# Patient Record
Sex: Male | Born: 1990 | Race: Black or African American | Hispanic: No | Marital: Single | Smoking: Never smoker
Health system: Southern US, Community
[De-identification: ages and names within clinical notes are randomized; demographics above are authoritative.]

## PROBLEM LIST (undated history)

## (undated) DIAGNOSIS — D649 Anemia, unspecified: Secondary | ICD-10-CM

## (undated) DIAGNOSIS — T7840XA Allergy, unspecified, initial encounter: Secondary | ICD-10-CM

## (undated) HISTORY — DX: Anemia, unspecified: D64.9

## (undated) HISTORY — DX: Allergy, unspecified, initial encounter: T78.40XA

---
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
## 2011-01-23 LAB — COMPREHENSIVE METABOLIC PANEL
Albumin: 3.4 — ABNORMAL LOW
Alkaline Phosphatase: 108
BUN: 6
CO2: 26
Chloride: 101
Glucose, Bld: 101 — ABNORMAL HIGH
Potassium: 3.6
Total Bilirubin: 1

## 2011-01-23 LAB — BASIC METABOLIC PANEL
CO2: 29
Chloride: 100
Creatinine, Ser: 1.31
Potassium: 3.4 — ABNORMAL LOW
Sodium: 136

## 2011-01-23 LAB — CBC
HCT: 40
HCT: 43.5
Hemoglobin: 13.2
Hemoglobin: 14.4
MCHC: 33.2
MCV: 88.7
Platelets: 135 — ABNORMAL LOW
Platelets: 135 — ABNORMAL LOW
RBC: 4.53
RBC: 4.91
WBC: 5

## 2011-01-23 LAB — URINALYSIS, ROUTINE W REFLEX MICROSCOPIC
Bilirubin Urine: NEGATIVE
Hgb urine dipstick: NEGATIVE
Protein, ur: NEGATIVE
Urobilinogen, UA: 1

## 2011-01-23 LAB — DIFFERENTIAL
Basophils Absolute: 0
Basophils Absolute: 0
Basophils Relative: 0
Lymphocytes Relative: 9 — ABNORMAL LOW
Lymphs Abs: 0.6 — ABNORMAL LOW
Monocytes Absolute: 0.8
Neutro Abs: 2.8
Neutro Abs: 5.2
Neutrophils Relative %: 81 — ABNORMAL HIGH

## 2011-05-06 ENCOUNTER — Ambulatory Visit (INDEPENDENT_AMBULATORY_CARE_PROVIDER_SITE_OTHER): Payer: 59

## 2011-05-06 DIAGNOSIS — Z0000 Encounter for general adult medical examination without abnormal findings: Secondary | ICD-10-CM

## 2012-07-18 ENCOUNTER — Encounter (HOSPITAL_COMMUNITY): Payer: Self-pay | Admitting: Family Medicine

## 2012-07-18 ENCOUNTER — Emergency Department (HOSPITAL_COMMUNITY): Payer: Self-pay

## 2012-07-18 ENCOUNTER — Emergency Department (HOSPITAL_COMMUNITY)
Admission: EM | Admit: 2012-07-18 | Discharge: 2012-07-18 | Disposition: A | Discharge status: Home / Self Care | Payer: No Typology Code available for payment source | Attending: Emergency Medicine | Admitting: Emergency Medicine

## 2012-07-18 DIAGNOSIS — I498 Other specified cardiac arrhythmias: Secondary | ICD-10-CM | POA: Insufficient documentation

## 2012-07-18 DIAGNOSIS — Y9389 Activity, other specified: Secondary | ICD-10-CM | POA: Insufficient documentation

## 2012-07-18 DIAGNOSIS — R51 Headache: Secondary | ICD-10-CM | POA: Insufficient documentation

## 2012-07-18 DIAGNOSIS — R55 Syncope and collapse: Secondary | ICD-10-CM | POA: Insufficient documentation

## 2012-07-18 DIAGNOSIS — R001 Bradycardia, unspecified: Secondary | ICD-10-CM

## 2012-07-18 DIAGNOSIS — R110 Nausea: Secondary | ICD-10-CM | POA: Insufficient documentation

## 2012-07-18 DIAGNOSIS — Y92410 Unspecified street and highway as the place of occurrence of the external cause: Secondary | ICD-10-CM | POA: Insufficient documentation

## 2012-07-18 DIAGNOSIS — M542 Cervicalgia: Secondary | ICD-10-CM | POA: Insufficient documentation

## 2012-07-18 LAB — GLUCOSE, CAPILLARY: Glucose-Capillary: 78 mg/dL (ref 70–99)

## 2012-07-18 MED ORDER — METHOCARBAMOL 500 MG PO TABS: 1000.0000 mg | ORAL_TABLET | Freq: Four times a day (QID) | ORAL | Status: DC

## 2012-07-18 MED ORDER — IBUPROFEN 600 MG PO TABS: 600.0000 mg | ORAL_TABLET | Freq: Four times a day (QID) | ORAL | Status: DC | PRN

## 2012-07-18 MED ORDER — IBUPROFEN 400 MG PO TABS: 600.0000 mg | ORAL_TABLET | Freq: Once | ORAL | Status: DC

## 2012-07-18 NOTE — ED Notes (Signed)
Patient transported to CT and xray 
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
## 2012-07-18 NOTE — ED Provider Notes (Signed)
Medical screening examination/treatment/procedure(s) were performed by non-physician practitioner and as supervising physician I was immediately available for consultation/collaboration.    Vida Roller, MD 07/18/12 [phone]

## 2013-04-24 ENCOUNTER — Ambulatory Visit (INDEPENDENT_AMBULATORY_CARE_PROVIDER_SITE_OTHER): Payer: PRIVATE HEALTH INSURANCE | Admitting: Emergency Medicine

## 2013-04-24 VITALS — BP 126/84 | HR 45 | Temp 98.6°F | Resp 16 | Ht 72.25 in | Wt 155.0 lb

## 2013-04-24 DIAGNOSIS — Z0000 Encounter for general adult medical examination without abnormal findings: Secondary | ICD-10-CM

## 2013-04-24 DIAGNOSIS — Z23 Encounter for immunization: Secondary | ICD-10-CM

## 2013-04-24 NOTE — Progress Notes (Signed)
[facility] [address], [place] [address] [phone]  Date:  04/24/2013   Name:  Jason Mcintosh   DOB:  October 05, 1990   MRN:  [id number]  PCP:  Default, Provider, MD    Chief Complaint: Annual Exam   History of Present Illness:  Jason Mcintosh is a 22 y.o. very pleasant male patient who presents with the following:  Recently graduated and has moved to [place].  Now here for the holidays and requests a CPE.  No medications.  Daily runner.  No flu shot.  UTD on tetanus.  No improvement with over the counter medications or other home remedies. Denies other complaint or health concern today.   There are no active problems to display for this patient.   Past Medical History  Diagnosis Date  . Allergy   . Anemia     History reviewed. No pertinent past surgical history.  History  Substance Use Topics  . Smoking status: Never Smoker   . Smokeless tobacco: Not on file  . Alcohol Use: No    Family History  Problem Relation Age of Onset  . Healthy Mother   . Healthy Father   . Healthy Sister   . Healthy Brother     Allergies  Allergen Reactions  . Sulfa Antibiotics Swelling    Medication list has been reviewed and updated.  Current Outpatient Prescriptions on File Prior to Visit  Medication Sig Dispense Refill  . ibuprofen (ADVIL,MOTRIN) 600 MG tablet Take 1 tablet (600 mg total) by mouth every 6 (six) hours as needed for pain.  20 tablet  0  . methocarbamol (ROBAXIN) 500 MG tablet Take 2 tablets (1,000 mg total) by mouth 4 (four) times daily.  20 tablet  0   No current facility-administered medications on file prior to visit.    Review of Systems:  As per HPI, otherwise negative.    Physical Examination: Filed Vitals:   04/24/13 1604  BP: 126/84  Pulse: 45  Temp: 98.6 F (37 C)  Resp: 16   Filed Vitals:   04/24/13 1604  Height: 6' 0.25" (1.835 m)  Weight: 155 lb (70.308 kg)   Body mass index is 20.88 kg/(m^2). Ideal Body  Weight: Weight in (lb) to have BMI = 25: 185.2  GEN: WDWN, NAD, Non-toxic, A & O x 3 HEENT: Atraumatic, Normocephalic. Neck supple. No masses, No LAD. Ears and Nose: No external deformity. CV: RRR, No M/G/R. No JVD. No thrill. No extra heart sounds. PULM: CTA B, no wheezes, crackles, rhonchi. No retractions. No resp. distress. No accessory muscle use. ABD: S, NT, ND, +BS. No rebound. No HSM. EXTR: No c/c/e NEURO Normal gait.  PSYCH: Normally interactive. Conversant. Not depressed or anxious appearing.  Calm demeanor.    Assessment and Plan: Routine physical  Labs pending in morning as he is not fasting   Signed,  Phillips Odor, MD

## 2015-01-29 ENCOUNTER — Ambulatory Visit: Payer: Worker's Compensation

## 2015-01-29 ENCOUNTER — Ambulatory Visit (INDEPENDENT_AMBULATORY_CARE_PROVIDER_SITE_OTHER): Payer: Worker's Compensation | Admitting: Family Medicine

## 2015-01-29 VITALS — BP 118/80 | HR 50 | Temp 98.4°F | Resp 16 | Ht 73.0 in | Wt 154.0 lb

## 2015-01-29 DIAGNOSIS — S6991XA Unspecified injury of right wrist, hand and finger(s), initial encounter: Secondary | ICD-10-CM

## 2015-01-29 DIAGNOSIS — S63501A Unspecified sprain of right wrist, initial encounter: Secondary | ICD-10-CM | POA: Diagnosis not present

## 2015-01-29 NOTE — Progress Notes (Signed)
Subjective:  This chart was scribed for Jason Sorenson, MD by [facility], medical scribe at [facility].The patient was seen in exam room 02 and the patient's care was started at 5:14 PM.   Patient ID: Jason Mcintosh, male    DOB: [birth year], 24 y.o.   MRN: [id number] Chief Complaint  Patient presents with  . Wrist Injury    right, x 1 day   HPI HPI Comments: Jason Mcintosh is a 24 y.o. male who presents to [facility] complaining of a right wrist injury while at work.  Flattened out in the air and landed on his wrist while trying to avoid landing on a kid. Feels pain up to his forearm. Pain occasionally shoots up to his shoulder. RICE treatment and wrapping for relief. 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   No current outpatient prescriptions on file prior to visit.   No current facility-administered medications on file prior to visit.   Allergies  Allergen Reactions  . Sulfa Antibiotics Swelling   Review of Systems  Constitutional: Negative for fever, chills, diaphoresis, activity change, appetite change, fatigue and unexpected weight change.  Respiratory: Negative for shortness of breath.   Cardiovascular: Negative for chest pain, palpitations and leg swelling.  Gastrointestinal: Negative for vomiting and abdominal pain.  Musculoskeletal: Positive for myalgias, joint swelling and arthralgias.  Skin: Negative for color change, pallor and wound.  Neurological: Negative for weakness and numbness.  Hematological: Negative for adenopathy. Does not bruise/bleed easily.      Objective:  BP 118/80 mmHg  Pulse 50  Temp(Src) 98.4 F (36.9 C) (Oral)  Resp 16  Ht  (1.854 m)  Wt 154 lb (69.854 kg)  BMI 20.32 kg/m2  SpO2 99% Physical Exam  Constitutional: He is oriented to person, place, and time. He appears well-developed and well-nourished. No distress.  HENT:  Head: Normocephalic and atraumatic.  Eyes: Pupils are equal, round, and reactive  to light.  Neck: Normal range of motion.  Cardiovascular: Normal rate and regular rhythm.   Pulmonary/Chest: Effort normal. No respiratory distress.  Musculoskeletal: Normal range of motion.  No ROM in his hand. Pain with moving fingers and elbow. No ability to pronate or supinate. Moderate edema over the dorsum of his hand. No discoloration or bruising. Positive snuff box tenderness. Severe swelling ulnar aspect to mid forearm.  Neurological: He is alert and oriented to person, place, and time.  Skin: Skin is warm and dry.  Psychiatric: He has a normal mood and affect. His behavior is normal.  Nursing note and vitals reviewed.  [facility] reading (PRIMARY) by Dr. Clelia Croft: Right wrist and right forearm: no acute bony abnormality no fracture.    Dg Forearm Right  01/29/2015   CLINICAL DATA:  pain after fall on trampoline onto hyperextended wrist  EXAM: RIGHT FOREARM - 2 VIEW  COMPARISON:  None.  FINDINGS: There is no evidence of fracture or other focal bone lesions. Soft tissues are unremarkable.  IMPRESSION: Negative.   Electronically Signed   By: Ellery Plunk M.D.   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 Initial encounter.  EXAM: RIGHT WRIST - COMPLETE 3+ VIEW  COMPARISON:  None.  FINDINGS: No evidence of acute fracture or dislocation. Joint spaces well preserved. Well-preserved bone mineral density. No intrinsic osseous abnormalities.  IMPRESSION: Normal examination.   Electronically Signed   By: Hulan Saas M.D.   On: 01/29/2015 18:17    Assessment & Plan:   1. Wrist injuries, right, initial encounter   2. Wrist sprain, right, initial encounter   placed in prefab splint and wear sling while at work. RICE. Light duty. Recheck in 4d  Orders Placed This Encounter  Procedures  . DG Wrist Complete Right    Standing Status: Future     Number of  Occurrences: 1     Standing Expiration Date: 01/29/2016    Order Specific Question:  Reason for Exam (SYMPTOM  OR DIAGNOSIS REQUIRED)    Answer:  wrist pain, s/p fall with hyperextension 24 hrs prior - tender throughoutt    Order Specific Question:  Preferred imaging location?    Answer:  External  . DG Forearm Right    Standing Status: Future     Number of Occurrences: 1     Standing Expiration Date: 01/29/2016    Order Specific Question:  Reason for Exam (SYMPTOM  OR DIAGNOSIS REQUIRED)    Answer:  pain after fall on trampoline onto hyperextended wrist    Order Specific Question:  Preferred imaging location?    Answer:  External    I personally performed the services described in this documentation, which was scribed in my presence. The recorded information has been reviewed and considered, and addended by me as needed.  Jason Sorenson, MD MPH   By signing my name below, I, Nadim Abuhashem, attest that this documentation has been prepared under the direction and in the presence of Jason Sorenson, MD.  Electronically Signed: Conchita Paris, medical scribe. 01/29/2015, 5:20 PM.

## 2015-01-29 NOTE — Patient Instructions (Addendum)
I suspect you have a VERY severe sprain - the more you can continue with RICE in the next 3d, the more likely you will be to start to resume normal activities sooner.  Due to the amount of swelling you have I am concerned that there is a possbility that you have a complete ligament tear. We will recheck on Thursday and can consider need for MRI depending on how you are doing at that time. Wrist Sprain with Rehab A sprain is an injury in which a ligament that maintains the proper alignment of a joint is partially or completely torn. The ligaments of the wrist are susceptible to sprains. Sprains are classified into three categories. Grade 1 sprains cause pain, but the tendon is not lengthened. Grade 2 sprains include a lengthened ligament because the ligament is stretched or partially ruptured. With grade 2 sprains there is still function, although the function may be diminished. Grade 3 sprains are characterized by a complete tear of the tendon or muscle, and function is usually impaired. SYMPTOMS   Pain tenderness, inflammation, and/or bruising (contusion) of the injury.  A "pop" or tear felt and/or heard at the time of injury.  Decreased wrist function. CAUSES  A wrist sprain occurs when a force is placed on one or more ligaments that is greater than it/they can withstand. Common mechanisms of injury include:  Catching a ball with you hands.  Repetitive and/ or strenuous extension or flexion of the wrist. RISK INCREASES WITH:  Previous wrist injury.  Contact sports (boxing or wrestling).  Activities in which falling is common.  Poor strength and flexibility.  Improperly fitted or padded protective equipment. PREVENTION  Warm up and stretch properly before activity.  Allow for adequate recovery between workouts.  Maintain physical fitness:  Strength, flexibility, and endurance.  Cardiovascular fitness.  Protect the wrist joint by limiting its motion with the use of taping,  braces, or splints.  Protect the wrist after injury for 6 to 12 months. PROGNOSIS  The prognosis for wrist sprains depends on the degree of injury. Grade 1 sprains require 2 to 6 weeks of treatment. Grade 2 sprains require 6 to 8 weeks of treatment, and grade 3 sprains require up to 12 weeks.  RELATED COMPLICATIONS   Prolonged healing time, if improperly treated or re-injured.  Recurrent symptoms that result in a chronic problem.  Injury to nearby structures (bone, cartilage, nerves, or tendons).  Arthritis of the wrist.  Inability to compete in athletics at a high level.  Wrist stiffness or weakness.  Progression to a complete rupture of the ligament. TREATMENT  Treatment initially involves resting from any activities that aggravate the symptoms, and the use of ice and medications to help reduce pain and inflammation. Your caregiver may recommend immobilizing the wrist for a period of time in order to reduce stress on the ligament and allow for healing. After immobilization it is important to perform strengthening and stretching exercises to help regain strength and a full range of motion. These exercises may be completed at home or with a therapist. Surgery is not usually required for wrist sprains, unless the ligament has been ruptured (grade 3 sprain). MEDICATION   If pain medication is necessary, then nonsteroidal anti-inflammatory medications, such as aspirin and ibuprofen, or other minor pain relievers, such as acetaminophen, are often recommended.  Do not take pain medication for 7 days before surgery.  Prescription pain relievers may be given if deemed necessary by your caregiver. Use only as directed and  only as much as you need. HEAT AND COLD  Cold treatment (icing) relieves pain and reduces inflammation. Cold treatment should be applied for 10 to 15 minutes every 2 to 3 hours for inflammation and pain and immediately after any activity that aggravates your symptoms. Use ice  packs or massage the area with a piece of ice (ice massage).  Heat treatment may be used prior to performing the stretching and strengthening activities prescribed by your caregiver, physical therapist, or athletic trainer. Use a heat pack or soak your injury in warm water. SEEK MEDICAL CARE IF:  Treatment seems to offer no benefit, or the condition worsens.  Any medications produce adverse side effects. EXERCISES RANGE OF MOTION (ROM) AND STRETCHING EXERCISES - Wrist Sprain  These exercises may help you when beginning to rehabilitate your injury. Your symptoms may resolve with or without further involvement from your physician, physical therapist or athletic trainer. While completing these exercises, remember:   Restoring tissue flexibility helps normal motion to return to the joints. This allows healthier, less painful movement and activity.  An effective stretch should be held for at least 30 seconds.  A stretch should never be painful. You should only feel a gentle lengthening or release in the stretched tissue. RANGE OF MOTION - Wrist Flexion, Active-Assisted  Extend your right / left elbow with your fingers pointing down.*  Gently pull the back of your hand towards you until you feel a gentle stretch on the top of your forearm.  Hold this position for __________ seconds. Repeat __________ times. Complete this exercise __________ times per day.  *If directed by your physician, physical therapist or athletic trainer, complete this stretch with your elbow bent rather than extended. RANGE OF MOTION - Wrist Extension, Active-Assisted  Extend your right / left elbow and turn your palm upwards.*  Gently pull your palm/fingertips back so your wrist extends and your fingers point more toward the ground.  You should feel a gentle stretch on the inside of your forearm.  Hold this position for __________ seconds. Repeat __________ times. Complete this exercise __________ times per  day. *If directed by your physician, physical therapist or athletic trainer, complete this stretch with your elbow bent, rather than extended. RANGE OF MOTION - Supination, Active  Stand or sit with your elbows at your side. Bend your right / left elbow to 90 degrees.  Turn your palm upward until you feel a gentle stretch on the inside of your forearm.  Hold this position for __________ seconds. Slowly release and return to the starting position. Repeat __________ times. Complete this stretch __________ times per day.  RANGE OF MOTION - Pronation, Active  Stand or sit with your elbows at your side. Bend your right / left elbow to 90 degrees.  Turn your palm downward until you feel a gentle stretch on the top of your forearm.  Hold this position for __________ seconds. Slowly release and return to the starting position. Repeat __________ times. Complete this stretch __________ times per day.  STRETCH - Wrist Flexion  Place the back of your right / left hand on a tabletop leaving your elbow slightly bent. Your fingers should point away from your body.  Gently press the back of your hand down onto the table by straightening your elbow. You should feel a stretch on the top of your forearm.  Hold this position for __________ seconds. Repeat __________ times. Complete this stretch __________ times per day.  STRETCH - Wrist Extension  Place your  right / left fingertips on a tabletop leaving your elbow slightly bent. Your fingers should point backwards.  Gently press your fingers and palm down onto the table by straightening your elbow. You should feel a stretch on the inside of your forearm.  Hold this position for __________ seconds. Repeat __________ times. Complete this stretch __________ times per day.  STRENGTHENING EXERCISES - Wrist Sprain These exercises may help you when beginning to rehabilitate your injury. They may resolve your symptoms with or without further involvement from  your physician, physical therapist or athletic trainer. While completing these exercises, remember:   Muscles can gain both the endurance and the strength needed for everyday activities through controlled exercises.  Complete these exercises as instructed by your physician, physical therapist or athletic trainer. Progress with the resistance and repetition exercises only as your caregiver advises. STRENGTH - Wrist Flexors  Sit with your right / left forearm palm-up and fully supported. Your elbow should be resting below the height of your shoulder. Allow your wrist to extend over the edge of the surface.  Loosely holding a __________ weight or a piece of rubber exercise band/tubing, slowly curl your hand up toward your forearm.  Hold this position for __________ seconds. Slowly lower the wrist back to the starting position in a controlled manner. Repeat __________ times. Complete this exercise __________ times per day.  STRENGTH - Wrist Extensors  Sit with your right / left forearm palm-down and fully supported. Your elbow should be resting below the height of your shoulder. Allow your wrist to extend over the edge of the surface.  Loosely holding a __________ weight or a piece of rubber exercise band/tubing, slowly curl your hand up toward your forearm.  Hold this position for __________ seconds. Slowly lower the wrist back to the starting position in a controlled manner. Repeat __________ times. Complete this exercise __________ times per day.  STRENGTH - Ulnar Deviators  Stand with a ____________________ weight in your right / left hand, or sit holding on to the rubber exercise band/tubing with your opposite arm supported.  Move your wrist so that your pinkie travels toward your forearm and your thumb moves away from your forearm.  Hold this position for __________ seconds and then slowly lower the wrist back to the starting position. Repeat __________ times. Complete this exercise  __________ times per day STRENGTH - Radial Deviators  Stand with a ____________________ weight in your  right / left hand, or sit holding on to the rubber exercise band/tubing with your arm supported.  Raise your hand upward in front of you or pull up on the rubber tubing.  Hold this position for __________ seconds and then slowly lower the wrist back to the starting position. Repeat __________ times. Complete this exercise __________ times per day. STRENGTH - Forearm Supinators  Sit with your right / left forearm supported on a table, keeping your elbow below shoulder height. Rest your hand over the edge, palm down.  Gently grip a hammer or a soup ladle.  Without moving your elbow, slowly turn your palm and hand upward to a "thumbs-up" position.  Hold this position for __________ seconds. Slowly return to the starting position. Repeat __________ times. Complete this exercise __________ times per day.  STRENGTH - Forearm Pronators  Sit with your right / left forearm supported on a table, keeping your elbow below shoulder height. Rest your hand over the edge, palm up.  Gently grip a hammer or a soup ladle.  Without moving your  elbow, slowly turn your palm and hand upward to a "thumbs-up" position.  Hold this position for __________ seconds. Slowly return to the starting position. Repeat __________ times. Complete this exercise __________ times per day.  STRENGTH - Grip  Grasp a tennis ball, a dense sponge, or a large, rolled sock in your hand.  Squeeze as hard as you can without increasing any pain.  Hold this position for __________ seconds. Release your grip slowly. Repeat __________ times. Complete this exercise __________ times per day.  Document Released: 04/14/2005 Document Revised: 07/07/2011 Document Reviewed: 07/27/2008 [facility] Patient Information 2015 [place], [place]. This information is not intended to replace advice given to you by your health care provider. Make  sure you discuss any questions you have with your health care provider.

## 2015-01-31 NOTE — Progress Notes (Signed)
Patient has a scheduled appointment on 02/01/15 @ 1pm with Dr Clelia Croft for a WC Follow-up.

## 2015-02-01 ENCOUNTER — Other Ambulatory Visit: Payer: Self-pay | Admitting: Family Medicine

## 2015-06-26 ENCOUNTER — Ambulatory Visit (INDEPENDENT_AMBULATORY_CARE_PROVIDER_SITE_OTHER): Payer: No Typology Code available for payment source | Admitting: Internal Medicine

## 2015-06-26 VITALS — BP 108/70 | HR 50 | Temp 98.4°F | Resp 18 | Ht 75.0 in | Wt 159.8 lb

## 2015-06-26 DIAGNOSIS — M25511 Pain in right shoulder: Secondary | ICD-10-CM

## 2015-06-26 MED ORDER — MELOXICAM 15 MG PO TABS: 15.0000 mg | ORAL_TABLET | Freq: Every day | ORAL | Status: AC

## 2015-06-26 NOTE — Patient Instructions (Signed)

## 2015-06-26 NOTE — Progress Notes (Signed)
   Subjective:    Patient ID: Jason Mcintosh, male    DOB: 1991/02/12, 25 y.o.   MRN: [id number]  HPI  Chief Complaint  Patient presents with  . Shoulder Pain    right shoulder pain. Cannot lift arm above head, across chest.    25 year old healthy patient complaining of shoulder pain for the last 2 weeks. No known injury. Early in the day about 2 weeks ago he noticed trouble with range of motion. It hurts to brush teeth/ Hair. It hurts to pick things up. Job supervising trampoline center. Active in sports. No prior shoulder problems  Review of Systems Noncontributory    Objective:   Physical Exam BP 108/70 mmHg  Pulse 50  Temp(Src) 98.4 F (36.9 C) (Oral)  Resp 18  Ht  (1.905 m)  Wt 159 lb 12.8 oz (72.485 kg)  BMI 19.97 kg/m2  SpO2 99% Appears healthy in no acute distress Right shoulder is tender to palpation over the ac joint Biceps tendon is intact but tender at the attachment of the acromioclavicular joint He has pain with external rotation, and adduction, and anterior elevation against resistance. Adduction good 90. He is mildly tender in the trapezius with some tightness but is nontender along the scapular border. Biceps function is good No chest wall tenderness No upper extremity neurological findings      Assessment & Plan:  Acromio clavicular tendinitis/bursitis  Meloxicam Ice for swelling or pain Heat morning Exercises twice a day Follow-up 2-4 weeks if not well

## 2015-11-04 ENCOUNTER — Encounter (HOSPITAL_COMMUNITY): Payer: Self-pay | Admitting: Emergency Medicine

## 2015-11-04 ENCOUNTER — Ambulatory Visit (HOSPITAL_COMMUNITY)
Admission: EM | Admit: 2015-11-04 | Discharge: 2015-11-04 | Disposition: A | Discharge status: Home / Self Care | Payer: PRIVATE HEALTH INSURANCE | Attending: Family Medicine | Admitting: Family Medicine

## 2015-11-04 DIAGNOSIS — S01511A Laceration without foreign body of lip, initial encounter: Secondary | ICD-10-CM | POA: Diagnosis not present

## 2015-11-04 MED ORDER — CEPHALEXIN 500 MG PO CAPS: 500.0000 mg | ORAL_CAPSULE | Freq: Four times a day (QID) | ORAL | Status: AC
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
## 2015-11-04 NOTE — ED Notes (Signed)
D/c by frank patrick, pa 

## 2015-11-04 NOTE — ED Provider Notes (Signed)
CSN: [id number]     Arrival date & time 11/04/15  1943 History   First MD Initiated Contact with Patient 11/04/15 2053     Chief Complaint  Patient presents with  . Oral Swelling   (Consider location/radiation/quality/duration/timing/severity/associated sxs/prior Treatment) HPI History obtained from patient: Location: Left lower lip  Context/Duration: Last night during performance struck standing speaker with left lower lip  Severity: 2   Quality: Aching Timing:       Constant     Home Treatment: Wound cleaning Associated symptoms:  None Family History: All healthy known history    Past Medical History  Diagnosis Date  . Allergy   . Anemia    History reviewed. No pertinent past surgical history. Family History  Problem Relation Age of Onset  . Healthy Mother   . Healthy Father   . Healthy Sister   . Healthy Brother    Social History  Substance Use Topics  . Smoking status: Never Smoker   . Smokeless tobacco: Never Used  . Alcohol Use: No    Review of Systems  Denies: HEADACHE, NAUSEA, ABDOMINAL PAIN, CHEST PAIN, CONGESTION, DYSURIA, SHORTNESS OF BREATH  Allergies  Sulfa antibiotics  Home Medications   Prior to Admission medications   Medication Sig Start Date End Date Taking? Authorizing Provider  cephALEXin (KEFLEX) 500 MG capsule Take 1 capsule (500 mg total) by mouth 4 (four) times daily. 11/04/15   Tharon AquasFrank C Edward Trevino, PA  meloxicam (MOBIC) 15 MG tablet Take 1 tablet (15 mg total) by mouth daily. 06/26/15   Tonye Pearsonobert P Doolittle, MD   Meds Ordered and Administered this Visit  Medications - No data to display  BP 122/63 mmHg  Pulse 49  Temp(Src) 97.8 F (36.6 C) (Oral)  Resp 16  SpO2 100% No data found.   Physical Exam NURSES NOTES AND VITAL SIGNS REVIEWED. CONSTITUTIONAL: Well developed, well nourished, no acute distress HEENT: normocephalic, atraumatic. Left lower lip swollen, non tender to palpation. Healed laceration from tooth.  EYES: Conjunctiva  normal NECK:normal ROM, supple, no adenopathy PULMONARY:No respiratory distress, normal effort ABDOMINAL: Soft, ND, NT BS+, No CVAT MUSCULOSKELETAL: Normal ROM of all extremities,  SKIN: warm and dry without rash PSYCHIATRIC: Mood and affect, behavior are normal  ED Course  Procedures (including critical care time)  Labs Review Labs Reviewed - No data to display  Imaging Review No results found.   Visual Acuity Review  Right Eye Distance:   Left Eye Distance:   Bilateral Distance:    Right Eye Near:   Left Eye Near:    Bilateral Near:         MDM   1. Lip laceration, initial encounter     Patient is reassured that there are no issues that require transfer to higher level of care at this time or additional tests. Patient is advised to continue home symptomatic treatment. Patient is advised that if there are new or worsening symptoms to attend the emergency department, contact primary care provider, or return to UC. Instructions of care provided discharged home in stable condition.    THIS NOTE WAS GENERATED USING A VOICE RECOGNITION SOFTWARE PROGRAM. ALL REASONABLE EFFORTS  WERE MADE TO PROOFREAD THIS DOCUMENT FOR ACCURACY.  I have verbally reviewed the discharge instructions with the patient. A printed AVS was given to the patient.  All questions were answered prior to discharge.      Tharon AquasFrank C Landers Prajapati, PA 11/04/15 2113

## 2015-11-04 NOTE — Discharge Instructions (Signed)
Laceration Care, Adult  A laceration is a cut that goes through all layers of the skin. The cut also goes into the tissue that is right under the skin. Some cuts heal on their own. Others need to be closed with stitches (sutures), staples, skin adhesive strips, or wound glue. Taking care of your cut lowers your risk of infection and helps your cut to heal better.  HOW TO TAKE CARE OF YOUR CUT  For stitches or staples:  · Keep the wound clean and dry.  · If you were given a bandage (dressing), you should change it at least one time per day or as told by your doctor. You should also change it if it gets wet or dirty.  · Keep the wound completely dry for the first 24 hours or as told by your doctor. After that time, you may take a shower or a bath. However, make sure that the wound is not soaked in water until after the stitches or staples have been removed.  · Clean the wound one time each day or as told by your doctor:    Wash the wound with soap and water.    Rinse the wound with water until all of the soap comes off.    Pat the wound dry with a clean towel. Do not rub the wound.  · After you clean the wound, put a thin layer of antibiotic ointment on it as told by your doctor. This ointment:    Helps to prevent infection.    Keeps the bandage from sticking to the wound.  · Have your stitches or staples removed as told by your doctor.  If your doctor used skin adhesive strips:   · Keep the wound clean and dry.  · If you were given a bandage, you should change it at least one time per day or as told by your doctor. You should also change it if it gets dirty or wet.  · Do not get the skin adhesive strips wet. You can take a shower or a bath, but be careful to keep the wound dry.  · If the wound gets wet, pat it dry with a clean towel. Do not rub the wound.  · Skin adhesive strips fall off on their own. You can trim the strips as the wound heals. Do not remove any strips that are still stuck to the wound. They will  fall off after a while.  If your doctor used wound glue:  · Try to keep your wound dry, but you may briefly wet it in the shower or bath. Do not soak the wound in water, such as by swimming.  · After you take a shower or a bath, gently pat the wound dry with a clean towel. Do not rub the wound.  · Do not do any activities that will make you really sweaty until the skin glue has fallen off on its own.  · Do not apply liquid, cream, or ointment medicine to your wound while the skin glue is still on.  · If you were given a bandage, you should change it at least one time per day or as told by your doctor. You should also change it if it gets dirty or wet.  · If a bandage is placed over the wound, do not let the tape for the bandage touch the skin glue.  · Do not pick at the glue. The skin glue usually stays on for 5-10 days. Then, it   falls off of the skin.  General Instructions   · To help prevent scarring, make sure to cover your wound with sunscreen whenever you are outside after stitches are removed, after adhesive strips are removed, or when wound glue stays in place and the wound is healed. Make sure to wear a sunscreen of at least 30 SPF.  · Take over-the-counter and prescription medicines only as told by your doctor.  · If you were given antibiotic medicine or ointment, take or apply it as told by your doctor. Do not stop using the antibiotic even if your wound is getting better.  · Do not scratch or pick at the wound.  · Keep all follow-up visits as told by your doctor. This is important.  · Check your wound every day for signs of infection. Watch for:    Redness, swelling, or pain.    Fluid, blood, or pus.  · Raise (elevate) the injured area above the level of your heart while you are sitting or lying down, if possible.  GET HELP IF:  · You got a tetanus shot and you have any of these problems at the injection site:    Swelling.    Very bad pain.    Redness.    Bleeding.  · You have a fever.  · A wound that was  closed breaks open.  · You notice a bad smell coming from your wound or your bandage.  · You notice something coming out of the wound, such as wood or glass.  · Medicine does not help your pain.  · You have more redness, swelling, or pain at the site of your wound.  · You have fluid, blood, or pus coming from your wound.  · You notice a change in the color of your skin near your wound.  · You need to change the bandage often because fluid, blood, or pus is coming from the wound.  · You start to have a new rash.  · You start to have numbness around the wound.  GET HELP RIGHT AWAY IF:  · You have very bad swelling around the wound.  · Your pain suddenly gets worse and is very bad.  · You notice painful lumps near the wound or on skin that is anywhere on your body.  · You have a red streak going away from your wound.  · The wound is on your hand or foot and you cannot move a finger or toe like you usually can.  · The wound is on your hand or foot and you notice that your fingers or toes look pale or bluish.     This information is not intended to replace advice given to you by your health care provider. Make sure you discuss any questions you have with your health care provider.     Document Released: 10/01/2007 Document Revised: 08/29/2014 Document Reviewed: 04/10/2014  [employer] Interactive Patient Education ©2016 [employer]

## 2016-01-26 ENCOUNTER — Ambulatory Visit (INDEPENDENT_AMBULATORY_CARE_PROVIDER_SITE_OTHER): Payer: Self-pay

## 2016-01-26 ENCOUNTER — Encounter (HOSPITAL_COMMUNITY): Payer: Self-pay | Admitting: Emergency Medicine

## 2016-01-26 ENCOUNTER — Ambulatory Visit (HOSPITAL_COMMUNITY)
Admission: EM | Admit: 2016-01-26 | Discharge: 2016-01-26 | Disposition: A | Discharge status: Home / Self Care | Payer: Self-pay | Attending: Family Medicine | Admitting: Family Medicine

## 2016-01-26 DIAGNOSIS — S161XXA Strain of muscle, fascia and tendon at neck level, initial encounter: Secondary | ICD-10-CM

## 2016-01-26 LAB — POCT URINALYSIS DIP (DEVICE)
BILIRUBIN URINE: NEGATIVE
GLUCOSE, UA: NEGATIVE mg/dL
KETONES UR: NEGATIVE mg/dL
LEUKOCYTES UA: NEGATIVE
Nitrite: NEGATIVE
PROTEIN: NEGATIVE mg/dL
Specific Gravity, Urine: 1.02 (ref 1.005–1.030)
Urobilinogen, UA: 1 mg/dL (ref 0.0–1.0)
pH: 7 (ref 5.0–8.0)

## 2016-01-26 MED ORDER — NAPROXEN SODIUM 550 MG PO TABS: 550.0000 mg | ORAL_TABLET | Freq: Two times a day (BID) | ORAL | 0 refills | Status: AC

## 2016-01-26 MED ORDER — CYCLOBENZAPRINE HCL 10 MG PO TABS: 10.0000 mg | ORAL_TABLET | Freq: Two times a day (BID) | ORAL | 0 refills | Status: AC | PRN
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
## 2016-01-26 NOTE — ED Notes (Signed)
D/c by Frank Patrick, PA  

## 2017-09-28 IMAGING — CR DG FOREARM 2V*R*
2 series · 2 of 2 positions shown · non-contrast
Comparison: None.

CLINICAL DATA: pain after fall on trampoline onto hyperextended
wrist

EXAM:
RIGHT FOREARM - 2 VIEW

[AP]
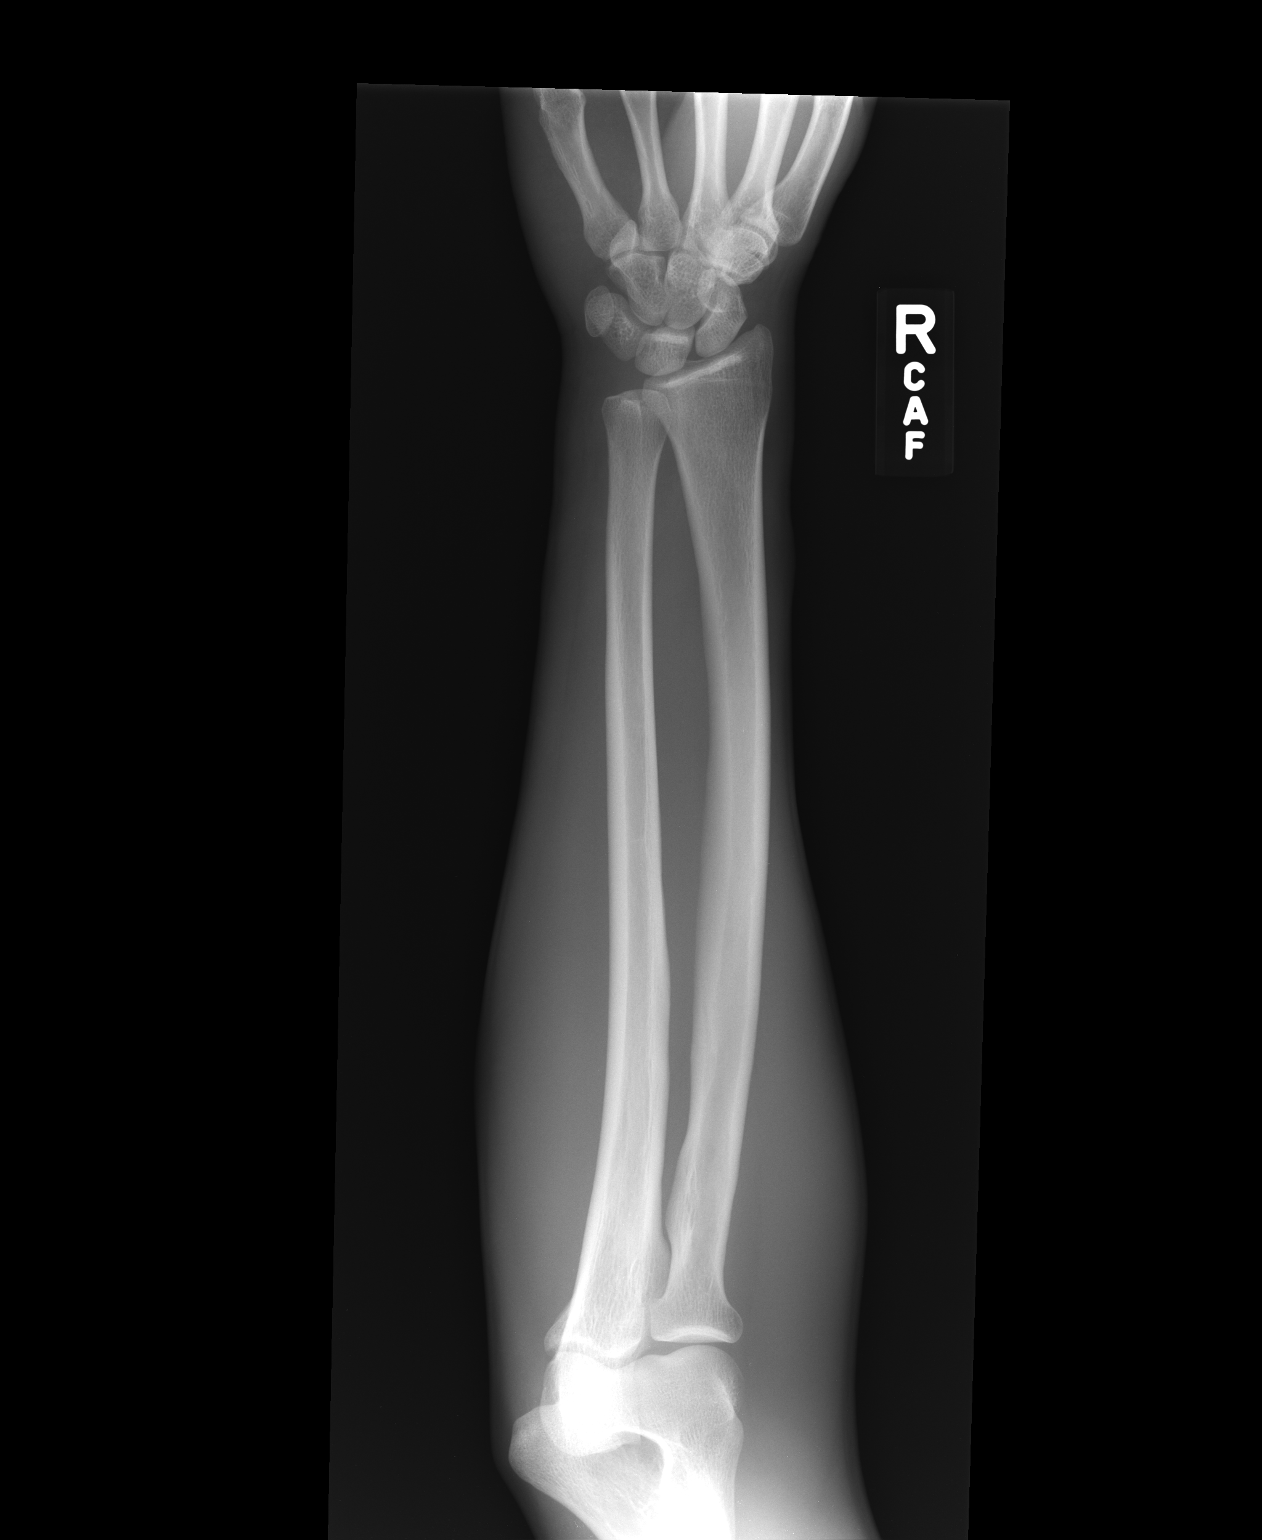

[lateral]
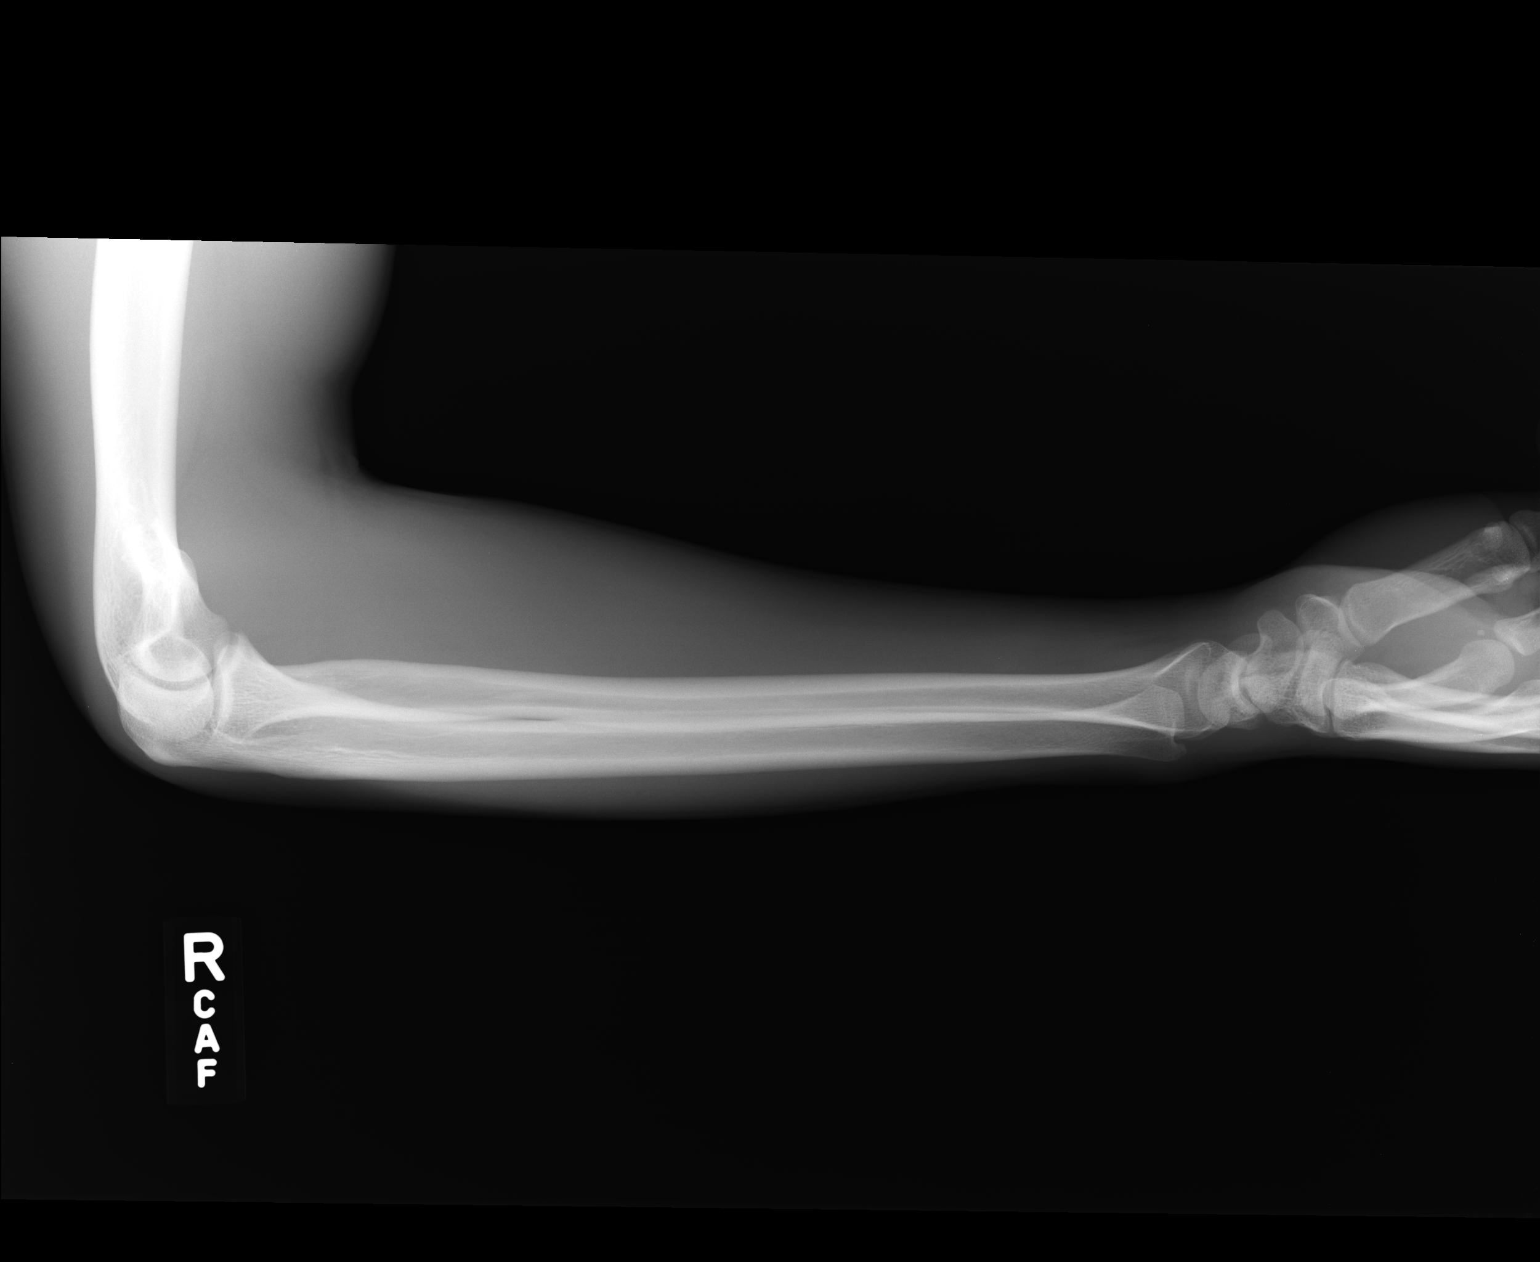

[2 of 2 positions shown; findings below may reference images not displayed]

FINDINGS: There is no evidence of fracture or other focal bone lesions. Soft
tissues are unremarkable.
IMPRESSION: Negative.

## 2020-09-22 ENCOUNTER — Emergency Department (HOSPITAL_COMMUNITY)
Admission: EM | Admit: 2020-09-22 | Discharge: 2020-09-22 | Disposition: A | Discharge status: Home / Self Care | Payer: Self-pay | Attending: Emergency Medicine | Admitting: Emergency Medicine

## 2020-09-22 ENCOUNTER — Other Ambulatory Visit: Payer: Self-pay

## 2020-09-22 ENCOUNTER — Emergency Department (HOSPITAL_COMMUNITY): Payer: Self-pay

## 2020-09-22 DIAGNOSIS — Z23 Encounter for immunization: Secondary | ICD-10-CM | POA: Insufficient documentation

## 2020-09-22 DIAGNOSIS — W25XXXA Contact with sharp glass, initial encounter: Secondary | ICD-10-CM | POA: Insufficient documentation

## 2020-09-22 DIAGNOSIS — S91115A Laceration without foreign body of left lesser toe(s) without damage to nail, initial encounter: Secondary | ICD-10-CM | POA: Insufficient documentation

## 2020-09-22 DIAGNOSIS — S91312A Laceration without foreign body, left foot, initial encounter: Secondary | ICD-10-CM

## 2020-09-22 MED ORDER — LIDOCAINE HCL (PF) 1 % IJ SOLN
5.0000 mL | Freq: Once | INTRAMUSCULAR | Status: DC
  Filled 2020-09-22: qty 30

## 2020-09-22 MED ORDER — TETANUS-DIPHTH-ACELL PERTUSSIS 5-2.5-18.5 LF-MCG/0.5 IM SUSY
0.5000 mL | PREFILLED_SYRINGE | Freq: Once | INTRAMUSCULAR | Status: AC
  Administered 2020-09-22: 0.5 mL via INTRAMUSCULAR
  Filled 2020-09-22: qty 0.5

## 2020-09-22 MED ORDER — BACITRACIN ZINC 500 UNIT/GM EX OINT
TOPICAL_OINTMENT | Freq: Two times a day (BID) | CUTANEOUS | Status: DC
  Filled 2020-09-22: qty 0.9

## 2020-09-22 NOTE — ED Provider Notes (Signed)
[facility]-EMERGENCY DEPT Provider Note   CSN: [id number] Arrival date & time: 09/22/20  0010     History Chief Complaint  Patient presents with  . Extremity Laceration    Jason Mcintosh is a 30 y.o. male.  Patient to ED for evaluation of laceration to left foot after stepping on broken glass earlier this evening. No other injury. Tetanus out of date.   The history is provided by the patient. No language interpreter was used.       Past Medical History:  Diagnosis Date  . Allergy   . Anemia     There are no problems to display for this patient.   No past surgical history on file.     Family History  Problem Relation Age of Onset  . Healthy Mother   . Healthy Father   . Healthy Sister   . Healthy Brother     Social History   Tobacco Use  . Smoking status: Never Smoker  . Smokeless tobacco: Never Used  Substance Use Topics  . Alcohol use: No    Alcohol/week: 0.0 standard drinks    Home Medications Prior to Admission medications   Medication Sig Start Date End Date Taking? Authorizing Provider  cephALEXin (KEFLEX) 500 MG capsule Take 1 capsule (500 mg total) by mouth 4 (four) times daily. 11/04/15   Tharon Aquas, PA  cyclobenzaprine (FLEXERIL) 10 MG tablet Take 1 tablet (10 mg total) by mouth 2 (two) times daily as needed for muscle spasms. 01/26/16   Tharon Aquas, PA  meloxicam (MOBIC) 15 MG tablet Take 1 tablet (15 mg total) by mouth daily. 06/26/15   Tonye Pearson, MD  naproxen sodium (ANAPROX DS) 550 MG tablet Take 1 tablet (550 mg total) by mouth 2 (two) times daily with a meal. 01/26/16   Tharon Aquas, PA    Allergies    Sulfa antibiotics  Review of Systems   Review of Systems  Musculoskeletal:       See HPI  Skin: Positive for wound.  Neurological: Negative for numbness.    Physical Exam Updated Vital Signs BP 117/70 (BP Location: Right Arm)   Pulse 95   Temp 98 F (36.7 C) (Oral)   Resp 18   Ht 6'  3" (1.905 m)   Wt 76.2 kg   SpO2 96%   BMI 21.00 kg/m   Physical Exam Constitutional:      Appearance: He is well-developed.  Pulmonary:     Effort: Pulmonary effort is normal.  Musculoskeletal:        General: Normal range of motion.     Cervical back: Normal range of motion.     Comments: 1 cm linear laceration to plantar 5th left toe. 3 cm curved laceration extending from base of 5th toe plantar surface.   Skin:    General: Skin is warm and dry.  Neurological:     Mental Status: He is alert and oriented to person, place, and time.     ED Results / Procedures / Treatments   Labs (all labs ordered are listed, but only abnormal results are displayed) Labs Reviewed - No data to display  EKG None  Radiology DG Foot Complete Left  Result Date: 09/22/2020 CLINICAL DATA:  Stepped on glass EXAM: LEFT FOOT - COMPLETE 3+ VIEW COMPARISON:  None. FINDINGS: No fracture or malalignment. Possible small linear foreign bodies at the level of fourth proximal phalanx. IMPRESSION: Possible small linear foreign bodies at the  level of fourth proximal phalanx. Electronically Signed   By: Jasmine Pang M.D.   On: 09/22/2020 02:11    Procedures .Marland KitchenLaceration Repair  Date/Time: 09/22/2020 3:44 AM Performed by: Elpidio Anis, PA-C Authorized by: Elpidio Anis, PA-C   Consent:    Consent obtained:  Verbal   Consent given by:  Patient and parent   Risks discussed:  Infection Universal protocol:    Procedure explained and questions answered to patient or proxy's satisfaction: yes     Immediately prior to procedure, a time out was called: yes     Patient identity confirmed:  Verbally with patient Anesthesia:    Anesthesia method:  Local infiltration   Local anesthetic:  Lidocaine 1% w/o epi Laceration details:    Location:  Foot   Foot location:  Sole of L foot   Length (cm):  4 Pre-procedure details:    Preparation:  Patient was prepped and draped in usual sterile fashion and imaging  obtained to evaluate for foreign bodies Exploration:    Hemostasis achieved with:  Direct pressure   Imaging outcome: foreign body noted   Treatment:    Area cleansed with:  Povidone-iodine and saline   Amount of cleaning:  Extensive   Irrigation solution:  Sterile saline   Irrigation method:  Syringe   Visualized foreign bodies/material removed: no (No FBs visualized)   Skin repair:    Repair method:  Sutures   Suture size:  3-0   Suture material:  Prolene   Suture technique:  Simple interrupted   Number of sutures:  8 Approximation:    Approximation:  Close Repair type:    Repair type:  Intermediate Post-procedure details:    Dressing:  Antibiotic ointment and non-adherent dressing   Procedure completion:  Tolerated well, no immediate complications     Medications Ordered in ED Medications  Tdap (BOOSTRIX) injection 0.5 mL (has no administration in time range)  lidocaine (PF) (XYLOCAINE) 1 % injection 5 mL (has no administration in time range)    ED Course  I have reviewed the triage vital signs and the nursing notes.  Pertinent labs & imaging results that were available during my care of the patient were reviewed by me and considered in my medical decision making (see chart for details).    MDM Rules/Calculators/A&P                          Patient to ED with laceration to plantar left foot with questionable foreign bodies requiring extensive irrigation and wound exploration. No FB's observed.   Wound closed as per procedure note. Discussed risk of infection, risk of retained FB's. Sutures to be removed in 10 days.  Final Clinical Impression(s) / ED Diagnoses Final diagnoses:  None   1. Left foot laceration  Rx / DC Orders ED Discharge Orders    None       Elpidio Anis, PA-C 09/22/20 0348    Palumbo, April, MD 09/22/20 [phone]

## 2020-09-22 NOTE — ED Triage Notes (Signed)
Pt came in with foot laceration to plantar portion of L foot. Pt stepped on glass

## 2020-09-22 NOTE — Discharge Instructions (Signed)
Sutures will need to be removed in 10 days. This can be done by your primary care doctor, at any [facility] of by returning to the ED.   Seek medical evaluation sooner if there is any sign of infection - increased pain or redness, drainage from the wound, or fever.   Take ibuprofen 600 mg (3 over the counter strength tablets) every 6 hours as needed for pain.

## 2020-10-04 ENCOUNTER — Emergency Department (HOSPITAL_COMMUNITY)
Admission: EM | Admit: 2020-10-04 | Discharge: 2020-10-04 | Disposition: A | Discharge status: Home / Self Care | Payer: Self-pay | Attending: Emergency Medicine | Admitting: Emergency Medicine

## 2020-10-04 ENCOUNTER — Other Ambulatory Visit: Payer: Self-pay

## 2020-10-04 DIAGNOSIS — Z4802 Encounter for removal of sutures: Secondary | ICD-10-CM | POA: Insufficient documentation

## 2020-10-04 DIAGNOSIS — S91312D Laceration without foreign body, left foot, subsequent encounter: Secondary | ICD-10-CM | POA: Insufficient documentation

## 2020-10-04 DIAGNOSIS — X58XXXD Exposure to other specified factors, subsequent encounter: Secondary | ICD-10-CM | POA: Insufficient documentation

## 2020-10-04 NOTE — ED Notes (Signed)
New dressing applied with gauze, coban, and kurlex

## 2020-10-04 NOTE — Discharge Instructions (Addendum)
Please follow up with your primary care provider within 5-7 days for re-evaluation of your symptoms. If you do not have a primary care provider, information for a healthcare clinic has been provided for you to make arrangements for follow up care. Please return to the emergency department for any new or worsening symptoms. ° °

## 2020-10-04 NOTE — ED Provider Notes (Signed)
[facility]-EMERGENCY DEPT Provider Note   CSN: [id number] Arrival date & time: 10/04/20  1652     History Chief Complaint  Patient presents with   Suture / Staple Removal    Jason Mcintosh is a 30 y.o. male.  HPI   30 y/o male with a history of allergies and anemia presents to the emergency department today for evaluation of suture removal.  He had sutures placed to the bottom of the left foot about 2 weeks ago.  He is still had some pain to the foot but there have been no fevers or drainage from the wound reported.  Past Medical History:  Diagnosis Date   Allergy    Anemia     There are no problems to display for this patient.   No past surgical history on file.     Family History  Problem Relation Age of Onset   Healthy Mother    Healthy Father    Healthy Sister    Healthy Brother     Social History   Tobacco Use   Smoking status: Never   Smokeless tobacco: Never  Substance Use Topics   Alcohol use: No    Alcohol/week: 0.0 standard drinks    Home Medications Prior to Admission medications   Medication Sig Start Date End Date Taking? Authorizing Provider  cephALEXin (KEFLEX) 500 MG capsule Take 1 capsule (500 mg total) by mouth 4 (four) times daily. 11/04/15   Tharon Aquas, PA  cyclobenzaprine (FLEXERIL) 10 MG tablet Take 1 tablet (10 mg total) by mouth 2 (two) times daily as needed for muscle spasms. 01/26/16   Tharon Aquas, PA  meloxicam (MOBIC) 15 MG tablet Take 1 tablet (15 mg total) by mouth daily. 06/26/15   Tonye Pearson, MD  naproxen sodium (ANAPROX DS) 550 MG tablet Take 1 tablet (550 mg total) by mouth 2 (two) times daily with a meal. 01/26/16   Tharon Aquas, PA    Allergies    Sulfa antibiotics  Review of Systems   Review of Systems  Constitutional:  Negative for fever.  Musculoskeletal:        Foot pain  Skin:  Positive for wound.   Physical Exam Updated Vital Signs BP 121/68 (BP Location: Right Arm)    Pulse (!) 54   Temp 98.1 F (36.7 C) (Oral)   Resp 15   Ht 6\' 3"  (1.905 m)   Wt 77.1 kg   SpO2 99%   BMI 21.25 kg/m   Physical Exam Constitutional:      General: He is not in acute distress.    Appearance: He is well-developed.  Eyes:     Conjunctiva/sclera: Conjunctivae normal.  Cardiovascular:     Rate and Rhythm: Normal rate.  Pulmonary:     Effort: Pulmonary effort is normal.  Musculoskeletal:     Comments: Wound noted to the bottom of the left foot which appears to be well-healing except there is a very small area of wound separation towards the fifth digit.  There is no erythema, warmth or drainage from the wound  Skin:    General: Skin is warm and dry.  Neurological:     Mental Status: He is alert and oriented to person, place, and time.    ED Results / Procedures / Treatments   Labs (all labs ordered are listed, but only abnormal results are displayed) Labs Reviewed - No data to display  EKG None  Radiology No results found.  Procedures .Suture Removal  Date/Time: 10/04/2020 6:26 PM Performed by: Karrie Meres, PA-C Authorized by: Karrie Meres, PA-C   Consent:    Consent obtained:  Verbal   Consent given by:  Patient   Risks, benefits, and alternatives were discussed: yes     Risks discussed:  Bleeding   Alternatives discussed:  No treatment Universal protocol:    Procedure explained and questions answered to patient or proxy's satisfaction: yes     Immediately prior to procedure, a time out was called: yes     Patient identity confirmed:  Verbally with patient Procedure details:    Wound appearance:  No signs of infection   Number of sutures removed:  8 Post-procedure details:    Post-removal:  Dressing applied   Procedure completion:  Tolerated   Medications Ordered in ED Medications - No data to display  ED Course  I have reviewed the triage vital signs and the nursing notes.  Pertinent labs & imaging results that were available  during my care of the patient were reviewed by me and considered in my medical decision making (see chart for details).    MDM Rules/Calculators/A&P                          Staple removal   Pt to ER for staple/suture removal and wound check as above. Procedure tolerated well. Vitals normal, no signs of infection. Scar minimization & return precautions given at dc.    Final Clinical Impression(s) / ED Diagnoses Final diagnoses:  Visit for suture removal    Rx / DC Orders ED Discharge Orders     None        Rayne Du 10/04/20 1827    Arby Barrette, MD 10/09/20 2103

## 2020-10-04 NOTE — ED Triage Notes (Signed)
Pt came from home via POV. Pt reports suture placement approximately 2 weeks ago on plantar area of left foot that he needs removed today. Pt denies any sign of infection.

## 2023-05-23 IMAGING — CR DG FOOT COMPLETE 3+V*L*
3 series · 3 of 3 positions shown · non-contrast
Comparison: None.

CLINICAL DATA: Stepped on glass

EXAM:
LEFT FOOT - COMPLETE 3+ VIEW

[x foot ap left]
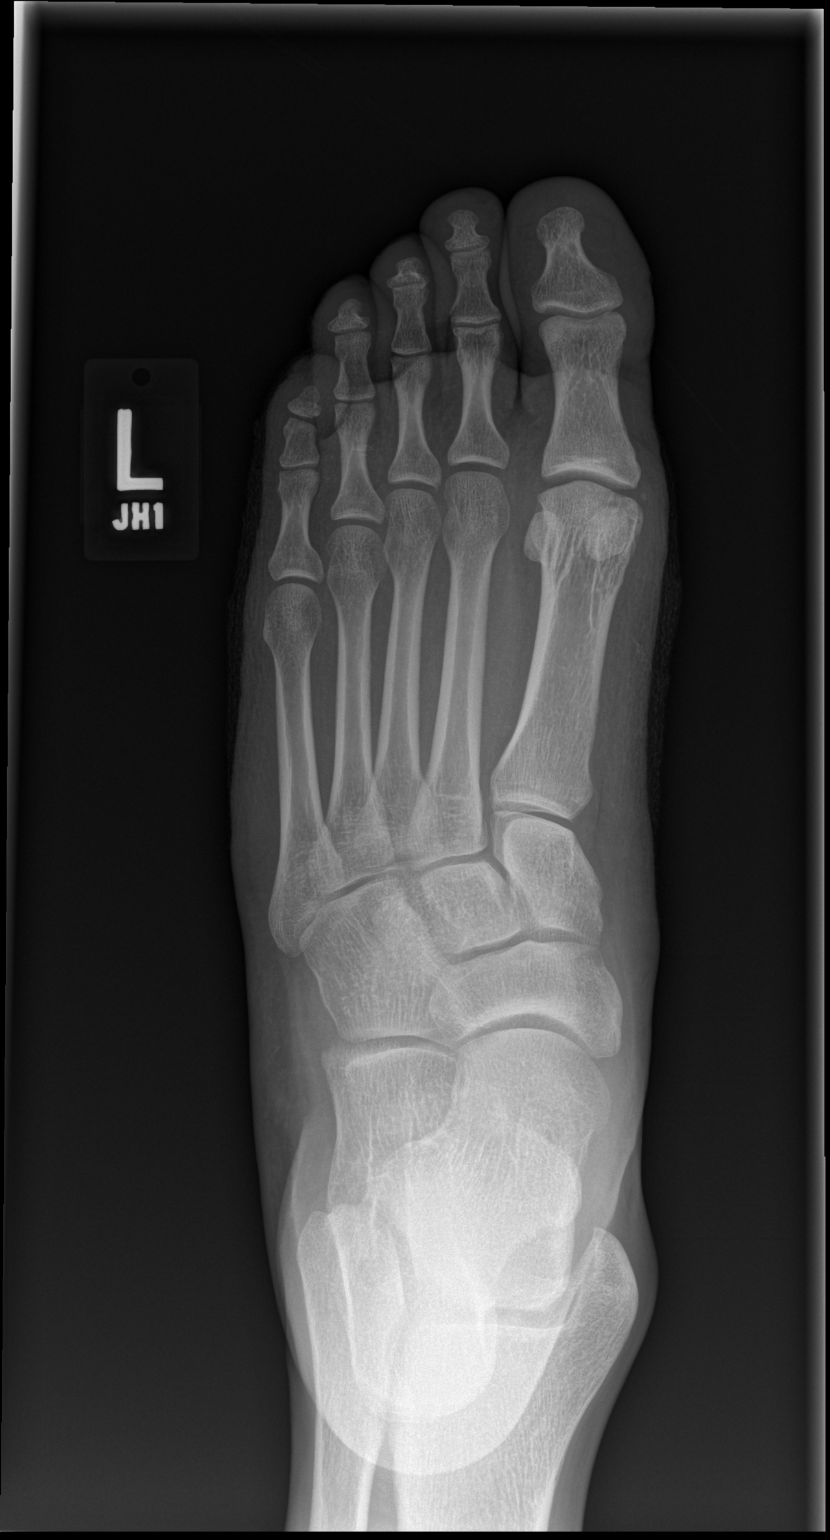

[x foot obl left]
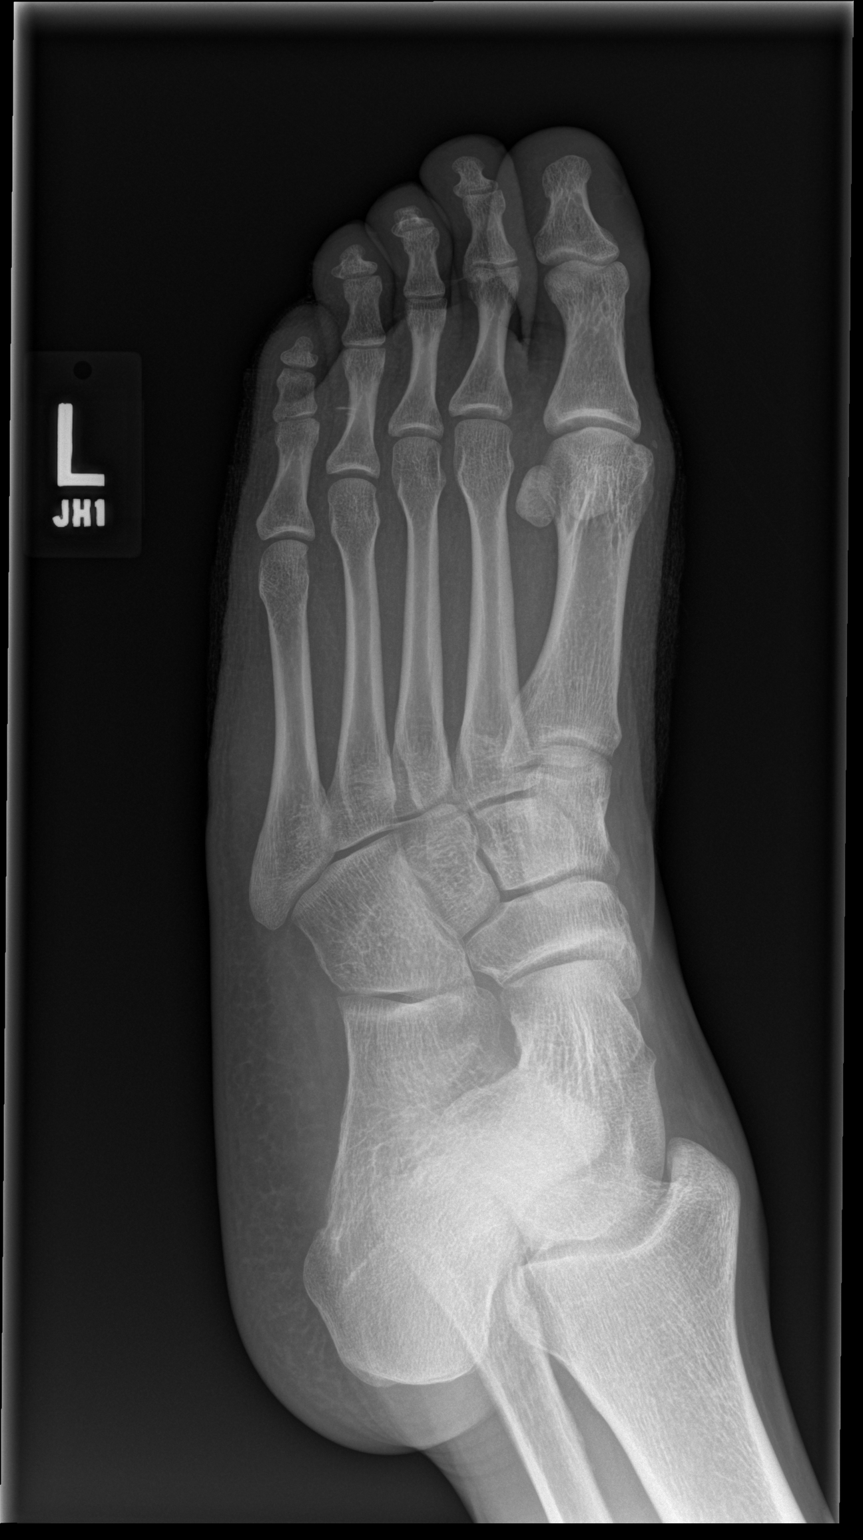

[x foot lat left]
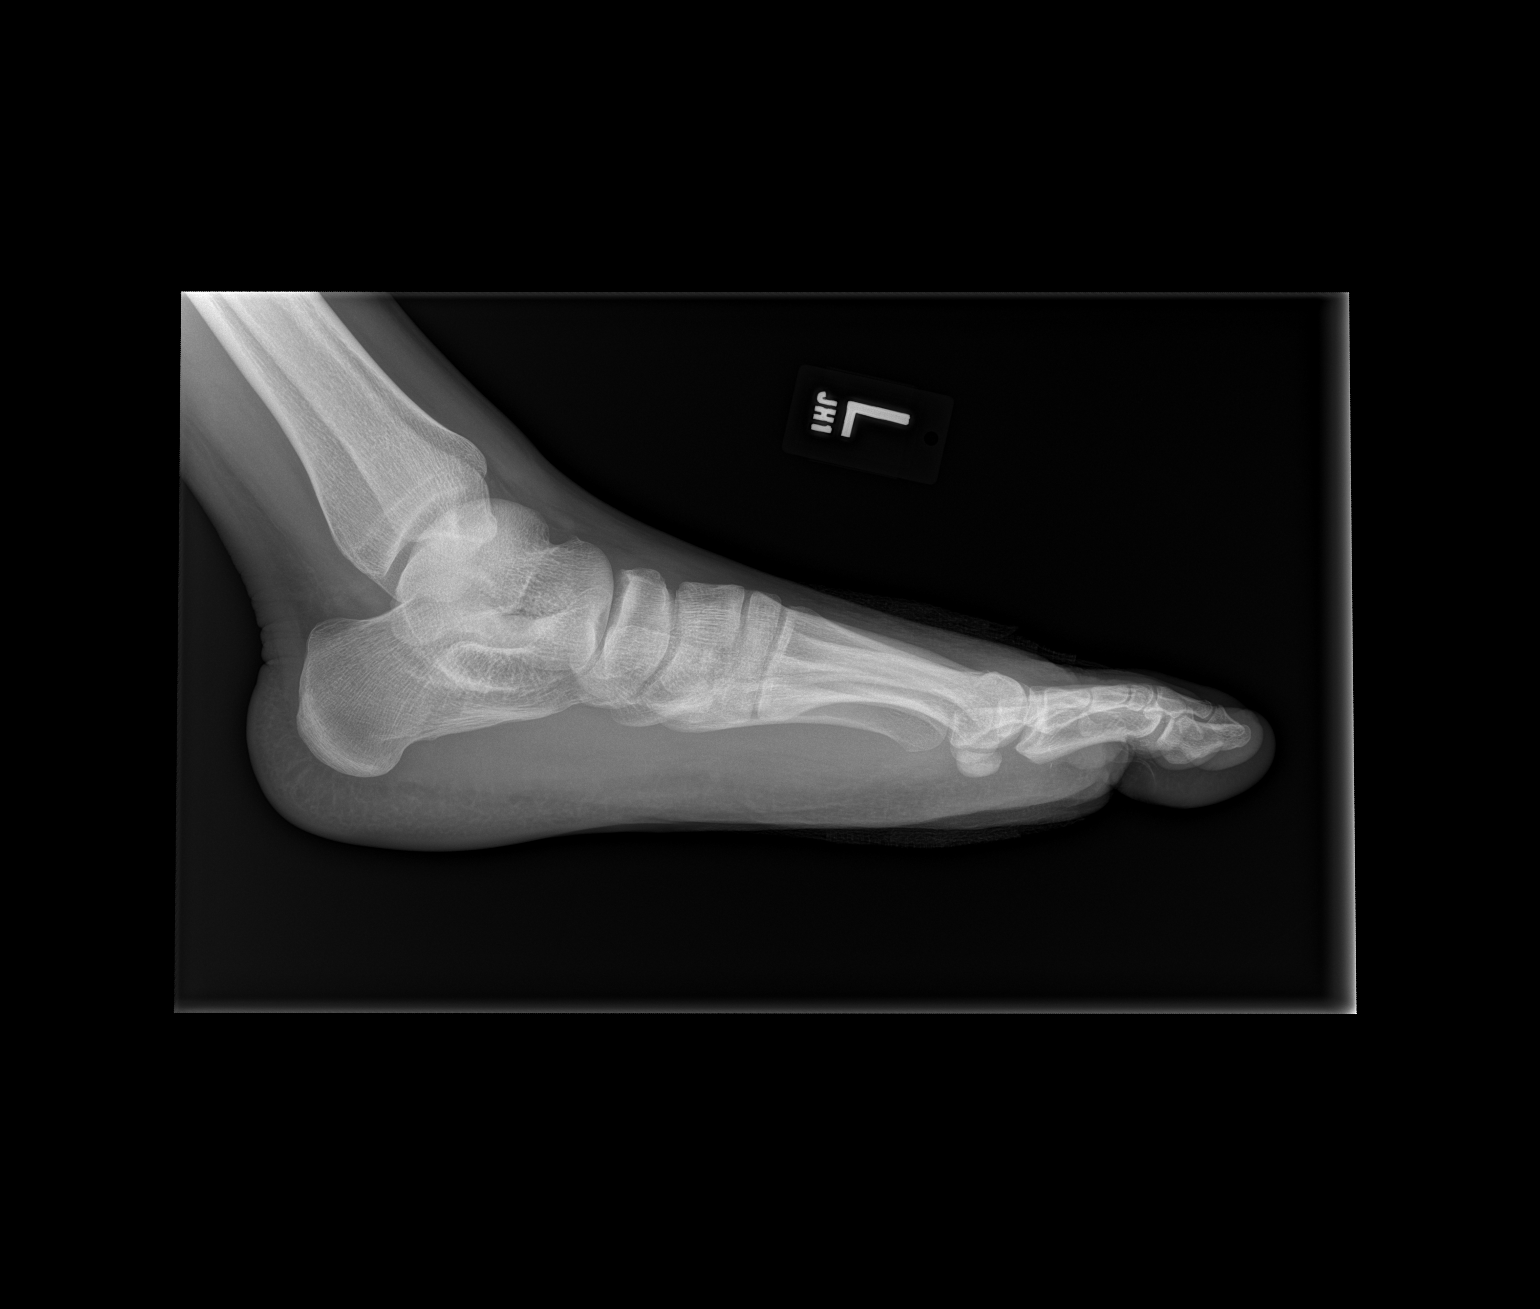

[3 of 3 positions shown; findings below may reference images not displayed]

FINDINGS: No fracture or malalignment. Possible small linear foreign bodies at
the level of fourth proximal phalanx.
IMPRESSION: Possible small linear foreign bodies at the level of fourth proximal
phalanx.
